# Patient Record
Sex: Male | Born: 2000 | Race: Black or African American | Hispanic: No | Marital: Single | Smoking: Never smoker
Health system: Southern US, Community
[De-identification: ages and names within clinical notes are randomized; demographics above are authoritative.]

---
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
## 2021-07-21 DIAGNOSIS — Z113 Encounter for screening for infections with a predominantly sexual mode of transmission: Secondary | ICD-10-CM | POA: Diagnosis not present

## 2021-07-31 DIAGNOSIS — L02415 Cutaneous abscess of right lower limb: Secondary | ICD-10-CM | POA: Diagnosis not present

## 2021-07-31 DIAGNOSIS — T63484A Toxic effect of venom of other arthropod, undetermined, initial encounter: Secondary | ICD-10-CM | POA: Diagnosis not present

## 2022-04-05 DIAGNOSIS — N342 Other urethritis: Secondary | ICD-10-CM | POA: Diagnosis not present

## 2022-04-05 DIAGNOSIS — Z202 Contact with and (suspected) exposure to infections with a predominantly sexual mode of transmission: Secondary | ICD-10-CM | POA: Diagnosis not present

## 2022-11-06 DIAGNOSIS — I4430 Unspecified atrioventricular block: Secondary | ICD-10-CM | POA: Diagnosis not present

## 2022-11-06 DIAGNOSIS — R001 Bradycardia, unspecified: Secondary | ICD-10-CM | POA: Diagnosis not present

## 2022-11-06 DIAGNOSIS — R42 Dizziness and giddiness: Secondary | ICD-10-CM | POA: Diagnosis not present

## 2022-11-06 DIAGNOSIS — I959 Hypotension, unspecified: Secondary | ICD-10-CM | POA: Diagnosis not present

## 2022-11-06 DIAGNOSIS — I499 Cardiac arrhythmia, unspecified: Secondary | ICD-10-CM | POA: Diagnosis not present

## 2022-11-17 DIAGNOSIS — A749 Chlamydial infection, unspecified: Secondary | ICD-10-CM | POA: Diagnosis not present

## 2022-11-17 DIAGNOSIS — A549 Gonococcal infection, unspecified: Secondary | ICD-10-CM | POA: Diagnosis not present
